# Patient Record
Sex: Female | Born: 1945 | Race: White | Hispanic: No | Smoking: Former smoker
Health system: Southern US, Community
[De-identification: ages and names within clinical notes are randomized; demographics above are authoritative.]

## PROBLEM LIST (undated history)

## (undated) DIAGNOSIS — J45909 Unspecified asthma, uncomplicated: Secondary | ICD-10-CM

## (undated) DIAGNOSIS — I82409 Acute embolism and thrombosis of unspecified deep veins of unspecified lower extremity: Secondary | ICD-10-CM

## (undated) HISTORY — PX: NO PAST SURGERIES: SHX2092

---

## 1997-11-06 ENCOUNTER — Ambulatory Visit (HOSPITAL_COMMUNITY): Admission: RE | Admit: 1997-11-06 | Discharge: 1997-11-06 | Payer: Self-pay | Admitting: *Deleted

## 1998-10-29 ENCOUNTER — Encounter: Payer: Self-pay | Admitting: *Deleted

## 1998-10-29 ENCOUNTER — Ambulatory Visit (HOSPITAL_COMMUNITY): Admission: RE | Admit: 1998-10-29 | Discharge: 1998-10-29 | Payer: Self-pay | Admitting: *Deleted

## 1999-11-02 ENCOUNTER — Encounter: Payer: Self-pay | Admitting: *Deleted

## 1999-11-02 ENCOUNTER — Ambulatory Visit (HOSPITAL_COMMUNITY): Admission: RE | Admit: 1999-11-02 | Discharge: 1999-11-02 | Payer: Self-pay | Admitting: *Deleted

## 2000-11-03 ENCOUNTER — Ambulatory Visit (HOSPITAL_COMMUNITY): Admission: RE | Admit: 2000-11-03 | Discharge: 2000-11-03 | Payer: Self-pay | Admitting: *Deleted

## 2000-11-03 ENCOUNTER — Encounter: Payer: Self-pay | Admitting: *Deleted

## 2001-11-06 ENCOUNTER — Ambulatory Visit (HOSPITAL_COMMUNITY): Admission: RE | Admit: 2001-11-06 | Discharge: 2001-11-06 | Payer: Self-pay | Admitting: *Deleted

## 2001-11-06 ENCOUNTER — Encounter: Payer: Self-pay | Admitting: *Deleted

## 2002-11-14 ENCOUNTER — Ambulatory Visit (HOSPITAL_COMMUNITY): Admission: RE | Admit: 2002-11-14 | Discharge: 2002-11-14 | Payer: Self-pay | Admitting: *Deleted

## 2002-11-14 ENCOUNTER — Encounter: Payer: Self-pay | Admitting: *Deleted

## 2003-11-14 ENCOUNTER — Ambulatory Visit (HOSPITAL_COMMUNITY): Admission: RE | Admit: 2003-11-14 | Discharge: 2003-11-14 | Payer: Self-pay | Admitting: *Deleted

## 2004-11-16 ENCOUNTER — Ambulatory Visit (HOSPITAL_COMMUNITY): Admission: RE | Admit: 2004-11-16 | Discharge: 2004-11-16 | Payer: Self-pay | Admitting: *Deleted

## 2005-11-17 ENCOUNTER — Ambulatory Visit (HOSPITAL_COMMUNITY): Admission: RE | Admit: 2005-11-17 | Discharge: 2005-11-17 | Payer: Self-pay | Admitting: Obstetrics and Gynecology

## 2006-11-23 ENCOUNTER — Ambulatory Visit (HOSPITAL_COMMUNITY): Admission: RE | Admit: 2006-11-23 | Discharge: 2006-11-23 | Payer: Self-pay | Admitting: Obstetrics and Gynecology

## 2007-05-14 ENCOUNTER — Ambulatory Visit: Payer: Self-pay | Admitting: *Deleted

## 2007-05-14 ENCOUNTER — Emergency Department (HOSPITAL_COMMUNITY): Admission: EM | Admit: 2007-05-14 | Discharge: 2007-05-14 | Payer: Self-pay | Admitting: Emergency Medicine

## 2007-05-14 ENCOUNTER — Encounter (INDEPENDENT_AMBULATORY_CARE_PROVIDER_SITE_OTHER): Payer: Self-pay | Admitting: Emergency Medicine

## 2007-11-24 ENCOUNTER — Ambulatory Visit (HOSPITAL_COMMUNITY): Admission: RE | Admit: 2007-11-24 | Discharge: 2007-11-24 | Payer: Self-pay | Admitting: Obstetrics and Gynecology

## 2008-11-29 ENCOUNTER — Ambulatory Visit (HOSPITAL_COMMUNITY): Admission: RE | Admit: 2008-11-29 | Discharge: 2008-11-29 | Payer: Self-pay | Admitting: Obstetrics and Gynecology

## 2015-06-03 MED FILL — PENICILLIN VK 500 MG TABLET: 500 | 7 days supply | Qty: 30 | Fill #0

## 2015-07-14 DIAGNOSIS — H5202 Hypermetropia, left eye: Secondary | ICD-10-CM | POA: Diagnosis not present

## 2015-07-14 DIAGNOSIS — H5211 Myopia, right eye: Secondary | ICD-10-CM | POA: Diagnosis not present

## 2015-07-14 DIAGNOSIS — H524 Presbyopia: Secondary | ICD-10-CM | POA: Diagnosis not present

## 2015-07-14 DIAGNOSIS — H52223 Regular astigmatism, bilateral: Secondary | ICD-10-CM | POA: Diagnosis not present

## 2015-07-14 DIAGNOSIS — H2513 Age-related nuclear cataract, bilateral: Secondary | ICD-10-CM | POA: Diagnosis not present

## 2015-11-11 DIAGNOSIS — I83813 Varicose veins of bilateral lower extremities with pain: Secondary | ICD-10-CM | POA: Diagnosis not present

## 2015-11-17 DIAGNOSIS — I83811 Varicose veins of right lower extremities with pain: Secondary | ICD-10-CM | POA: Diagnosis not present

## 2015-11-17 DIAGNOSIS — I8311 Varicose veins of right lower extremity with inflammation: Secondary | ICD-10-CM | POA: Diagnosis not present

## 2015-11-24 ENCOUNTER — Encounter (HOSPITAL_COMMUNITY): Payer: Self-pay | Admitting: Emergency Medicine

## 2015-11-24 ENCOUNTER — Emergency Department (HOSPITAL_COMMUNITY)
Admission: EM | Admit: 2015-11-24 | Discharge: 2015-11-24 | Disposition: A | Discharge status: Home / Self Care | Payer: Self-pay | Attending: Emergency Medicine | Admitting: Emergency Medicine

## 2015-11-24 DIAGNOSIS — M79602 Pain in left arm: Secondary | ICD-10-CM | POA: Diagnosis not present

## 2015-11-24 DIAGNOSIS — Z87891 Personal history of nicotine dependence: Secondary | ICD-10-CM | POA: Insufficient documentation

## 2015-11-24 HISTORY — DX: Acute embolism and thrombosis of unspecified deep veins of unspecified lower extremity: I82.409

## 2015-11-24 NOTE — ED Provider Notes (Signed)
[facility]-EMERGENCY DEPT Provider Note   CSN: [id number] Arrival date & time: 11/24/15  1120     History   Chief Complaint Chief Complaint  Patient presents with  . Arm Pain    HPI Jessica DoeCarolyn L Dingley is a 70 y.o. female.  The history is provided by the patient and medical records. No language interpreter was used.  Arm Pain  Pertinent negatives include no abdominal pain, no headaches and no shortness of breath.   Jessica Brown is a 70 y.o. female  with a PMH of DVT who presents to the Emergency Department complaining of dull achy left arm pain in the area where she received her flu shot approximately 3 weeks ago. She has been taking 3 ibuprofen at home with good relief of pain. Endorses associated "pins and needles" sensation around upper arm that will radiate down to mid forearm. Denies chest pain, shortness of breath, swelling, redness, fevers, neck pain, back pain. No history of similar sxs. No aggravating factors noted.   Past Medical History:  Diagnosis Date  . DVT (deep venous thrombosis) (HCC)     There are no active problems to display for this patient.   History reviewed. No pertinent surgical history.  OB History    No data available       Home Medications    Prior to Admission medications   Not on File    Family History History reviewed. No pertinent family history.  Social History Social History  Substance Use Topics  . Smoking status: Former Games developermoker  . Smokeless tobacco: Not on file  . Alcohol use No     Allergies   Review of patient's allergies indicates no known allergies.   Review of Systems Review of Systems  Constitutional: Negative for diaphoresis and fever.  HENT: Negative for congestion.   Eyes: Negative for visual disturbance.  Respiratory: Negative for cough and shortness of breath.   Cardiovascular: Negative.   Gastrointestinal: Negative for abdominal pain, nausea and vomiting.  Genitourinary: Negative for dysuria.    Musculoskeletal: Positive for myalgias. Negative for joint swelling and neck pain.  Skin: Negative for color change and rash.  Neurological: Negative for headaches.     Physical Exam Updated Vital Signs BP 118/76   Pulse 84   Temp 98.1 F (36.7 C) (Oral)   Resp 15   SpO2 100%   Physical Exam  Constitutional: She is oriented to person, place, and time. She appears well-developed and well-nourished. No distress.  HENT:  Head: Normocephalic and atraumatic.  Neck:  No midline or paraspinal tenderness.   Cardiovascular: Normal rate, regular rhythm, normal heart sounds and intact distal pulses.   No murmur heard. Pulmonary/Chest: Effort normal and breath sounds normal. No respiratory distress. She has no wheezes. She has no rales. She exhibits no tenderness.  Abdominal: Soft. She exhibits no distension. There is no tenderness.  Musculoskeletal:  Full ROM without pain. No tenderness, erythema, swelling, bruising. 2+ radial pulse. Sensation intact in axillary, radial, ulnar, median nerve distributions.  Neurological: She is alert and oriented to person, place, and time.  Skin: Skin is warm and dry.  Nursing note and vitals reviewed.    ED Treatments / Results  Labs (all labs ordered are listed, but only abnormal results are displayed) Labs Reviewed - No data to display  EKG  EKG Interpretation None       Radiology No results found.  Procedures Procedures (including critical care time)  Medications Ordered in ED Medications - No data  to display   Initial Impression / Assessment and Plan / ED Course  I have reviewed the triage vital signs and the nursing notes.  Pertinent labs & imaging results that were available during my care of the patient were reviewed by me and considered in my medical decision making (see chart for details).  Clinical Course   Jessica Brown is a 70 y.o. female who presents to ED for left arm pain x 3 weeks around injection site where she  received flu vaccine. Intermittent "pins and needles" sensation in this area which will occasionally radiate down to mid-forearm. On exam, patient is very well appearing with reassuring vital signs. No midline tenderness. Full ROM and 5/5 muscle strength. No overlying skin changes. No swelling. Evaluation does not show pathology that would require ongoing emergent intervention or inpatient treatment. PCP follow up if symptoms persist next week. Return precautions discussed and all questions answered.    Final Clinical Impressions(s) / ED Diagnoses   Final diagnoses:  Left arm pain    New Prescriptions There are no discharge medications for this patient.    Jessica PicketJaime Brown Jessica Little, PA-C 11/24/15 1605    Marily MemosJason Mesner, MD 11/24/15 [phone]

## 2015-11-24 NOTE — ED Triage Notes (Signed)
Advil treats pain effectively.

## 2015-11-24 NOTE — ED Triage Notes (Signed)
Pt c/o left arm pain and "pins and needles" x 1 week, radiating down arm and into shoulder. No chest discomfort, SOB. No recent injury. Attributes pain to flu shot obtained about a month ago.

## 2015-11-24 NOTE — Progress Notes (Signed)
Pt confirms no pcp to follow up with  Cm provided pt a list of in network [facility]  providers for f/u care  Pt informed Cm her ED RN stated she could come back to the ED

## 2015-11-24 NOTE — Discharge Instructions (Signed)
Continue taking ibuprofen as needed for pain. Follow up with your primary care physician if symptoms do not improve in the next week. Return to ER for new or worsening symptoms, any additional concerns.

## 2015-11-24 NOTE — ED Provider Notes (Signed)
Medical screening examination/treatment/procedure(s) were conducted as a shared visit with non-physician practitioner(s) and myself.  I personally evaluated the patient during the encounter.  Arm pain around flu shot from a few weeks ago.  On my exam, no swelling, erythema, ttp. Neuro exam in left upper extremity normal.  Unsure of cause. Considered possible neurologic or cervical abnormality but no persistent symptoms. Possibly localized reaction from medication? Will continue nsaids at home.    Marily MemosJason Shanena Pellegrino, MD 11/24/15 [phone]

## 2015-12-01 DIAGNOSIS — I83811 Varicose veins of right lower extremities with pain: Secondary | ICD-10-CM | POA: Diagnosis not present

## 2015-12-01 DIAGNOSIS — I83891 Varicose veins of right lower extremities with other complications: Secondary | ICD-10-CM | POA: Diagnosis not present

## 2015-12-01 DIAGNOSIS — I8311 Varicose veins of right lower extremity with inflammation: Secondary | ICD-10-CM | POA: Diagnosis not present

## 2015-12-31 DIAGNOSIS — I8311 Varicose veins of right lower extremity with inflammation: Secondary | ICD-10-CM | POA: Diagnosis not present

## 2015-12-31 DIAGNOSIS — I83891 Varicose veins of right lower extremities with other complications: Secondary | ICD-10-CM | POA: Diagnosis not present

## 2016-01-05 DIAGNOSIS — I8311 Varicose veins of right lower extremity with inflammation: Secondary | ICD-10-CM | POA: Diagnosis not present

## 2016-02-05 DIAGNOSIS — I8311 Varicose veins of right lower extremity with inflammation: Secondary | ICD-10-CM | POA: Diagnosis not present

## 2016-02-05 DIAGNOSIS — I83891 Varicose veins of right lower extremities with other complications: Secondary | ICD-10-CM | POA: Diagnosis not present

## 2017-07-01 ENCOUNTER — Emergency Department (HOSPITAL_COMMUNITY)
Admission: EM | Admit: 2017-07-01 | Discharge: 2017-07-01 | Disposition: A | Discharge status: Home / Self Care | Payer: 59 | Attending: Emergency Medicine | Admitting: Emergency Medicine

## 2017-07-01 ENCOUNTER — Encounter (HOSPITAL_COMMUNITY): Payer: Self-pay | Admitting: Emergency Medicine

## 2017-07-01 DIAGNOSIS — Z23 Encounter for immunization: Secondary | ICD-10-CM | POA: Insufficient documentation

## 2017-07-01 DIAGNOSIS — Y93H2 Activity, gardening and landscaping: Secondary | ICD-10-CM | POA: Diagnosis not present

## 2017-07-01 DIAGNOSIS — Y92007 Garden or yard of unspecified non-institutional (private) residence as the place of occurrence of the external cause: Secondary | ICD-10-CM | POA: Diagnosis not present

## 2017-07-01 DIAGNOSIS — W57XXXA Bitten or stung by nonvenomous insect and other nonvenomous arthropods, initial encounter: Secondary | ICD-10-CM | POA: Insufficient documentation

## 2017-07-01 DIAGNOSIS — Y999 Unspecified external cause status: Secondary | ICD-10-CM | POA: Diagnosis not present

## 2017-07-01 DIAGNOSIS — Z87891 Personal history of nicotine dependence: Secondary | ICD-10-CM | POA: Diagnosis not present

## 2017-07-01 DIAGNOSIS — S80262A Insect bite (nonvenomous), left knee, initial encounter: Secondary | ICD-10-CM | POA: Insufficient documentation

## 2017-07-01 MED ORDER — TETANUS-DIPHTH-ACELL PERTUSSIS 5-2.5-18.5 LF-MCG/0.5 IM SUSP
0.5000 mL | Freq: Once | INTRAMUSCULAR | Status: AC
  Administered 2017-07-01: 0.5 mL via INTRAMUSCULAR
  Filled 2017-07-01: qty 0.5

## 2017-07-01 MED ORDER — DOXYCYCLINE HYCLATE 100 MG PO TABS
200.0000 mg | ORAL_TABLET | Freq: Once | ORAL | Status: AC
  Administered 2017-07-01: 200 mg via ORAL
  Filled 2017-07-01: qty 2

## 2017-07-01 NOTE — Discharge Instructions (Addendum)
Keep wound clean with mild soap and water. Keep area covered with a topical antibiotic ointment and bandage. Ice and elevate for additional pain and swelling relief. Alternate between Ibuprofen and Tylenol for additional pain relief. You were given an antibiotic today that will cover you for possible lyme disease, so you shouldn't have any issues with this. Your tetanus shot was also updated. Follow up with your primary care doctor in approximately 5-7 days for wound recheck. Monitor area for signs of infection to include, but not limited to: increasing pain, spreading redness, drainage/pus, worsening swelling, or fevers. Return to emergency department for emergent changing or worsening symptoms.

## 2017-07-01 NOTE — ED Triage Notes (Signed)
Per pt, states she noct iced a tick behind her left knee-no rashes, no fevers

## 2017-07-01 NOTE — ED Provider Notes (Signed)
[facility]-EMERGENCY DEPT Provider Note   CSN: [id number] Arrival date & time: 07/01/17  1120     History   Chief Complaint Chief Complaint  Patient presents with  . Tick Removal    HPI Jessica Brown is a 72 y.o. female with a PMHx of DVT, who presents to the ED with complaints of which she believes to be a tick that is embedded in the left posterior knee.  She states that she does a lot of yard work pulling weeds and is outside a lot, 2 days ago noticed a bump on her knee posteriorly.  She was not aware until today that it was a tick.  She denies any pain to the area, has not tried anything for it, no known aggravating factors.  Additionally she denies any fevers, chills, CP, SOB, abd pain, N/V/D/C, hematuria, dysuria, myalgias, arthralgias, joint swelling, rashes, numbness, tingling, focal weakness, or any other complaints at this time.  NKDA. Otherwise healthy. Unsure of last Tdap.   The history is provided by the patient and medical records. No language interpreter was used.    Past Medical History:  Diagnosis Date  . DVT (deep venous thrombosis) (HCC)     There are no active problems to display for this patient.   History reviewed. No pertinent surgical history.   OB History   None      Home Medications    Prior to Admission medications   Not on File    Family History No family history on file.  Social History Social History   Tobacco Use  . Smoking status: Former Smoker  Substance Use Topics  . Alcohol use: No  . Drug use: No     Allergies   Patient has no known allergies.   Review of Systems Review of Systems  Constitutional: Negative for chills and fever.  Eyes: Negative for pain and redness.  Respiratory: Negative for shortness of breath.   Cardiovascular: Negative for chest pain.  Gastrointestinal: Negative for abdominal pain, constipation, diarrhea, nausea and vomiting.  Genitourinary: Negative for dysuria and  hematuria.  Musculoskeletal: Negative for arthralgias, joint swelling and myalgias.  Skin: Positive for wound (tick embedded into skin of knee). Negative for rash.  Allergic/Immunologic: Negative for immunocompromised state.  Neurological: Negative for weakness and numbness.  Psychiatric/Behavioral: Negative for confusion.   All other systems reviewed and are negative for acute change except as noted in the HPI.    Physical Exam Updated Vital Signs BP (!) 156/72 (BP Location: Right Arm)   Pulse 72   Temp 98.3 F (36.8 C) (Oral)   Resp 18   Ht 5\' 3"  (1.6 m)   Wt 72.4 kg (159 lb 11.2 oz)   SpO2 98%   BMI 28.29 kg/m   Physical Exam  Constitutional: She is oriented to person, place, and time. Vital signs are normal. She appears well-developed and well-nourished.  Non-toxic appearance. No distress.  Afebrile, nontoxic, NAD  HENT:  Head: Normocephalic and atraumatic.  Mouth/Throat: Mucous membranes are normal.  Eyes: Conjunctivae and EOM are normal. Right eye exhibits no discharge. Left eye exhibits no discharge.  Neck: Normal range of motion. Neck supple.  Cardiovascular: Normal rate and intact distal pulses.  Pulmonary/Chest: Effort normal. No respiratory distress.  Abdominal: Normal appearance. She exhibits no distension.  Musculoskeletal: Normal range of motion.  Neurological: She is alert and oriented to person, place, and time. She has normal strength. No sensory deficit.  Skin: Skin is warm, dry and  intact. Lesion (embedded tick) noted. No rash noted.  Tick embedded into popliteal fossa of L knee, slightly engorged, fully intact when removed. Still alive. No rashes, no surrounding erythema/warmth to the area, no drainage or evidence of infection to the wound.   Psychiatric: She has a normal mood and affect. Her behavior is normal.  Nursing note and vitals reviewed.    ED Treatments / Results  Labs (all labs ordered are listed, but only abnormal results are  displayed) Labs Reviewed - No data to display  EKG None  Radiology No results found.  Procedures .Foreign Body Removal Date/Time: 07/01/2017 3:45 PM Performed by: Rhona Raider, PA-C Authorized by: Rhona Raider, [place]  Consent: Verbal consent obtained. Risks and benefits: risks, benefits and alternatives were discussed Consent given by: patient Patient understanding: patient states understanding of the procedure being performed Patient consent: the patient's understanding of the procedure matches consent given Patient identity confirmed: verbally with patient Body area: skin General location: lower extremity Location details: left knee  Sedation: Patient sedated: no  Patient restrained: no Patient cooperative: yes Localization method: visualized Removal mechanism: cotton tipped applicator. Tendon involvement: none Depth: subcutaneous Complexity: simple 1 objects recovered. Objects recovered: tick Post-procedure assessment: foreign body removed Patient tolerance: Patient tolerated the procedure well with no immediate complications   (including critical care time)  Medications Ordered in ED Medications  doxycycline (VIBRA-TABS) tablet 200 mg (200 mg Oral Given 07/01/17 1613)  Tdap (BOOSTRIX) injection 0.5 mL (0.5 mLs Intramuscular Given 07/01/17 1614)     Initial Impression / Assessment and Plan / ED Course  I have reviewed the triage vital signs and the nursing notes.  Pertinent labs & imaging results that were available during my care of the patient were reviewed by me and considered in my medical decision making (see chart for details).     72 y.o. female here with tick embedded into L popliteal fossa of knee. Has been on there for 2 days, she didn't know it was a tick. Tick was successfully removed by using a cotton tipped applicator and slowly twist the tick in circular motions until it released, entirety of tick was removed. Will cover with prophylactic dose  of doxy, and update Tdap. Doubt need for ongoing abx. Advised local wound care, and f/up with PCP. Discussed case with my attending Dr. Juleen China who agrees with plan. I explained the diagnosis and have given explicit precautions to return to the ER including for any other new or worsening symptoms. The patient understands and accepts the medical plan as it's been dictated and I have answered their questions. Discharge instructions concerning home care and prescriptions have been given. The patient is STABLE and is discharged to home in good condition.    Final Clinical Impressions(s) / ED Diagnoses   Final diagnoses:  Tick bite with subsequent removal of tick    ED Discharge Orders    [address] [place], [place], [place] 07/01/17 1620    Raeford Razor, MD 07/04/17 [phone]

## 2019-01-17 ENCOUNTER — Other Ambulatory Visit: Payer: Self-pay

## 2019-01-17 ENCOUNTER — Ambulatory Visit: Payer: 59 | Attending: Internal Medicine

## 2019-01-17 DIAGNOSIS — Z20822 Contact with and (suspected) exposure to covid-19: Secondary | ICD-10-CM

## 2019-01-17 DIAGNOSIS — Z20828 Contact with and (suspected) exposure to other viral communicable diseases: Secondary | ICD-10-CM | POA: Diagnosis not present

## 2019-01-19 LAB — NOVEL CORONAVIRUS, NAA: SARS-CoV-2, NAA: NOT DETECTED

## 2019-01-22 ENCOUNTER — Telehealth: Payer: Self-pay | Admitting: General Practice

## 2019-01-22 NOTE — Telephone Encounter (Signed)
Negative COVID results given. Patient results "NOT Detected." Caller expressed understanding. ° °

## 2019-06-26 ENCOUNTER — Encounter (HOSPITAL_BASED_OUTPATIENT_CLINIC_OR_DEPARTMENT_OTHER): Payer: Self-pay

## 2019-06-26 ENCOUNTER — Emergency Department (HOSPITAL_BASED_OUTPATIENT_CLINIC_OR_DEPARTMENT_OTHER): Payer: 59

## 2019-06-26 ENCOUNTER — Emergency Department (HOSPITAL_BASED_OUTPATIENT_CLINIC_OR_DEPARTMENT_OTHER)
Admission: EM | Admit: 2019-06-26 | Discharge: 2019-06-26 | Disposition: A | Discharge status: Home / Self Care | Payer: 59 | Attending: Emergency Medicine | Admitting: Emergency Medicine

## 2019-06-26 ENCOUNTER — Other Ambulatory Visit: Payer: Self-pay

## 2019-06-26 DIAGNOSIS — M7989 Other specified soft tissue disorders: Secondary | ICD-10-CM | POA: Diagnosis not present

## 2019-06-26 DIAGNOSIS — Y929 Unspecified place or not applicable: Secondary | ICD-10-CM | POA: Diagnosis not present

## 2019-06-26 DIAGNOSIS — M25471 Effusion, right ankle: Secondary | ICD-10-CM

## 2019-06-26 DIAGNOSIS — S82491A Other fracture of shaft of right fibula, initial encounter for closed fracture: Secondary | ICD-10-CM | POA: Diagnosis not present

## 2019-06-26 DIAGNOSIS — Y999 Unspecified external cause status: Secondary | ICD-10-CM | POA: Diagnosis not present

## 2019-06-26 DIAGNOSIS — X58XXXA Exposure to other specified factors, initial encounter: Secondary | ICD-10-CM | POA: Diagnosis not present

## 2019-06-26 DIAGNOSIS — Y939 Activity, unspecified: Secondary | ICD-10-CM | POA: Insufficient documentation

## 2019-06-26 DIAGNOSIS — S99911A Unspecified injury of right ankle, initial encounter: Secondary | ICD-10-CM | POA: Diagnosis present

## 2019-06-26 DIAGNOSIS — M25571 Pain in right ankle and joints of right foot: Secondary | ICD-10-CM

## 2019-06-26 DIAGNOSIS — S82424A Nondisplaced transverse fracture of shaft of right fibula, initial encounter for closed fracture: Secondary | ICD-10-CM | POA: Diagnosis not present

## 2019-06-26 DIAGNOSIS — Z86718 Personal history of other venous thrombosis and embolism: Secondary | ICD-10-CM | POA: Diagnosis not present

## 2019-06-26 NOTE — ED Notes (Signed)
Pt discharged to home. Discharge instructions have been discussed with patient and/or family members. Pt verbally acknowledges understanding d/c instructions, and endorses comprehension to checkout at registration before leaving.  °

## 2019-06-26 NOTE — Discharge Instructions (Signed)
You have a fracture of the fibula of your right leg.  This is a nonweightbearing bone however it is important for you to wear your walking boot as we discussed.  I discussed your case with the orthopedic surgeon who will see you in office either late this week or early next week.  Please call the office phone number that I provided you with to make this appointment.  Please use Tylenol or ibuprofen for pain.  You may use 600 mg ibuprofen every 6 hours or 1000 mg of Tylenol every 6 hours.  You may choose to alternate between the 2.  This would be most effective.  Not to exceed 4 g of Tylenol within 24 hours.  Not to exceed 3200 mg ibuprofen 24 hours.   I have also provided you with a vascular medicine doctor as you have not followed up with you as recently you may reestablish with this vein specialist as needed.

## 2019-06-26 NOTE — ED Triage Notes (Signed)
Pt arrives with swelling and pain to right ankle X3 days, denies injury, pt reports working on feet a lot.

## 2019-06-26 NOTE — ED Provider Notes (Signed)
[facility] EMERGENCY DEPARTMENT Provider Note   CSN: [id number] Arrival date & time: 06/26/19  1044     History Chief Complaint  Patient presents with  . Leg Pain    Jessica Brown is a 74 y.o. female.  HPI  Patient is a 74 year old female with a history of DVT was unprovoked by surgery or other circumstance many years ago (patient was unable to specify # of years).   Patient is presented today for 3 days of EKG, constant right ankle/lower extremity pain that she states is worse at the end of the day.  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  She states that she is very active.  She denies any trauma.  She states that she has noticed some swelling in her legs.  She has chronic bilateral varicose veins.  She has been seen by a vein specialist in the past because of her DVT and because of her varicosities but has not been seen for some time.  She states that she was on warfarin for several years but stopped taking it greater than 5 years ago.  Patient denies any chest pain, shortness of breath, headache, lightheadedness, dizziness.     Past Medical History:  Diagnosis Date  . DVT (deep venous thrombosis) (HCC)     There are no problems to display for this patient.   History reviewed. No pertinent surgical history.   OB History   No obstetric history on file.     No family history on file.  Social History   Tobacco Use  . Smoking status: Former Smoker  Substance Use Topics  . Alcohol use: No  . Drug use: No    Home Medications Prior to Admission medications   Not on File    Allergies    Patient has no known allergies.  Review of Systems   Review of Systems  Constitutional: Negative for fever.  HENT: Negative for congestion.   Respiratory: Negative for shortness of breath.   Cardiovascular: Negative for chest pain.  Gastrointestinal: Negative for abdominal distention.  Musculoskeletal:      Right ankle, leg pain  Neurological: Negative for dizziness and headaches.    Physical Exam Updated Vital Signs BP (!) 146/74 (BP Location: Right Arm)   Pulse 85   Temp 98.6 F (37 C) (Oral)   Resp 16   Ht 5\' 3"  (1.6 m)   Wt 75.8 kg   SpO2 98%   BMI 29.58 kg/m   Physical Exam Vitals and nursing note reviewed.  Constitutional:      General: She is not in acute distress.    Appearance: Normal appearance. She is not ill-appearing.  HENT:     Head: Normocephalic and atraumatic.     Mouth/Throat:     Mouth: Mucous membranes are moist.  Eyes:     General: No scleral icterus.       Right eye: No discharge.        Left eye: No discharge.     Conjunctiva/sclera: Conjunctivae normal.  Cardiovascular:     Comments: DP and PT pulses 3+ symmetric bilaterally Pulmonary:     Effort: Pulmonary effort is normal.     Breath sounds: No stridor.  Musculoskeletal:     Comments: Mild tenderness to palpation of the right lateral malleolus.  No other tenderness of the right ankle.  There is mild swelling.  Some mild calf tenderness.  Skin:    General: Skin is warm and dry.     Capillary Refill: Capillary refill takes less than 2 seconds.  Neurological:     Mental Status: She is alert and oriented to person, place, and time. Mental status is at baseline.  Psychiatric:        Mood and Affect: Mood normal.        Behavior: Behavior normal.     ED Results / Procedures / Treatments   Labs (all labs ordered are listed, but only abnormal results are displayed) Labs Reviewed - No data to display  EKG None  Radiology DG Tibia/Fibula Right  Result Date: 06/26/2019 CLINICAL DATA:  Distal fracture, concern for proximal fracture. EXAM: RIGHT TIBIA AND FIBULA - 2 VIEW COMPARISON:  Same-day right ankle radiographs. FINDINGS: Nondisplaced transverse fracture deformity of the distal fibular shaft. Chronic posttraumatic deformity of the mid to lower fibular shaft. Knee and ankle remain  approximated. Moderate knee osteoarthrosis with chronic lateral tibial plateau depression deformity. Diffuse ankle soft tissue swelling. IMPRESSION: Acute nondisplaced transverse distal fibular shaft fracture. Chronic mid to lower fibular shaft posttraumatic deformity. Moderate knee osteoarthrosis with chronic lateral tibial plateau depression deformity. Electronically Signed   By: Stana Bunting M.D.   On: 06/26/2019 13:22   DG Ankle Complete Right  Result Date: 06/26/2019 CLINICAL DATA:  Right lateral ankle pain and swelling for 3 days. EXAM: RIGHT ANKLE - COMPLETE 3+ VIEW COMPARISON:  None. FINDINGS: Evidence of remote healed distal fibular fracture at the middle third distal third junction region. There is also a stress fracture involving the distal fibular shaft. No displacement. Some early cortical reaction. The tibia is intact. The ankle joint is maintained. No ankle fracture or osteochondral lesion. No definite joint effusion. The mid and hindfoot bony structures are intact. Moderate diffuse soft tissue swelling. IMPRESSION: 1. Nondisplaced stress fracture involving the distal fibular shaft. 2. Remote healed mid distal fibular shaft fracture. 3. No acute ankle fracture. Electronically Signed   By: Rudie Meyer M.D.   On: 06/26/2019 12:07   US Venous Img Lower Right (DVT Study)  Result Date: 06/26/2019 CLINICAL DATA:  Right ankle pain and swelling. EXAM: Right LOWER EXTREMITY VENOUS DOPPLER ULTRASOUND TECHNIQUE: Gray-scale sonography with compression, as well as color and duplex ultrasound, were performed to evaluate the deep venous system(s) from the level of the common femoral vein through the popliteal and proximal calf veins. COMPARISON:  None. FINDINGS: VENOUS Normal compressibility of the common femoral, superficial femoral, and popliteal veins, as well as the visualized calf veins. Visualized portions of profunda femoral vein and great saphenous vein unremarkable. No filling defects to  suggest DVT on grayscale or color Doppler imaging. Doppler waveforms show normal direction of venous flow, normal respiratory plasticity and response to augmentation. Limited views of the contralateral common femoral vein are unremarkable. OTHER None. Limitations: none IMPRESSION: Negative. Electronically Signed   By: Lupita Raider M.D.   On: 06/26/2019 12:17    Procedures Procedures (including critical care time)  Medications Ordered in ED Medications - No data to display  ED Course  I have reviewed the triage vital signs and the nursing notes.  Pertinent labs & imaging results that were available during my care of the patient were reviewed by me and considered in my medical decision making (see chart for details).  Patient with remote history of DVT was on warfarin for some number of years that she is uncertain of and has been off any anticoagulation for several  years.  She is presenting with right lower leg pain.  Some concern for DVT and there is some swelling of the right ankle with bony tenderness over the lateral malleolus.  She denies any trauma low suspicion for fracture however will obtain x-ray to rule out.  Clinical Course as of Jun 26 1419  Tue Jun 26, 2019  1328 X-ray imaging of the ankle significant for distal fibular fracture that is nondisplaced consistent with a stress fracture likely due to this patient being on her feet all day /daily. DVT scan negative. Discussed with Dr. Ronnald Nian who recommends ortho consultation and full tib/fib xray.    [WF]    Clinical Course User Index [WF] Tedd Sias, [place]   MDM Rules/Calculators/A&P                      I discussed this case with my attending physician who cosigned this note including patient's presenting symptoms, physical exam, and planned diagnostics and interventions. Attending physician stated agreement with plan or made changes to plan which were implemented.   Attending physician assessed patient at bedside.    Discussed with Ophelia Charter of orthopedics who recommends walking boot and follow up in the next week or so.   Patient with Tylenol and ibuprofen for pain.  Final Clinical Impression(s) / ED Diagnoses Final diagnoses:  Acute right ankle pain  Right ankle swelling  Other closed fracture of shaft of right fibula, initial encounter    Rx / DC Orders ED Discharge Orders    None       Tedd Sias, [place] 06/26/19 1421    Lennice Sites, DO 06/26/19 1454

## 2019-06-26 NOTE — ED Notes (Signed)
Patient transported to X-ray 

## 2019-06-29 DIAGNOSIS — M25571 Pain in right ankle and joints of right foot: Secondary | ICD-10-CM | POA: Diagnosis not present

## 2019-07-23 MED FILL — CELECOXIB 200 MG CAP: 200 | 30 days supply | Qty: 60 | Fill #0

## 2019-08-10 DIAGNOSIS — M25561 Pain in right knee: Secondary | ICD-10-CM | POA: Diagnosis not present

## 2019-08-10 DIAGNOSIS — M25571 Pain in right ankle and joints of right foot: Secondary | ICD-10-CM | POA: Diagnosis not present

## 2019-08-22 MED FILL — CELECOXIB 200 MG CAP: 200 | 30 days supply | Qty: 60 | Fill #1

## 2020-02-08 DIAGNOSIS — M25571 Pain in right ankle and joints of right foot: Secondary | ICD-10-CM | POA: Diagnosis not present

## 2020-02-22 ENCOUNTER — Other Ambulatory Visit: Payer: Self-pay | Admitting: Orthopaedic Surgery

## 2020-02-22 DIAGNOSIS — M79661 Pain in right lower leg: Secondary | ICD-10-CM | POA: Diagnosis not present

## 2020-02-22 DIAGNOSIS — M1711 Unilateral primary osteoarthritis, right knee: Secondary | ICD-10-CM | POA: Diagnosis not present

## 2020-02-25 ENCOUNTER — Ambulatory Visit (HOSPITAL_COMMUNITY): Payer: 59 | Admitting: Anesthesiology

## 2020-02-25 ENCOUNTER — Encounter (HOSPITAL_COMMUNITY): Payer: Self-pay | Admitting: Orthopaedic Surgery

## 2020-02-25 ENCOUNTER — Other Ambulatory Visit (HOSPITAL_COMMUNITY)
Admission: RE | Admit: 2020-02-25 | Discharge: 2020-02-25 | Disposition: A | Discharge status: Home / Self Care | Payer: 59 | Source: Ambulatory Visit | Attending: Orthopaedic Surgery | Admitting: Orthopaedic Surgery

## 2020-02-25 DIAGNOSIS — Z20822 Contact with and (suspected) exposure to covid-19: Secondary | ICD-10-CM | POA: Diagnosis not present

## 2020-02-25 DIAGNOSIS — Z01812 Encounter for preprocedural laboratory examination: Secondary | ICD-10-CM | POA: Diagnosis not present

## 2020-02-25 LAB — SARS CORONAVIRUS 2 (TAT 6-24 HRS): SARS Coronavirus 2: NEGATIVE

## 2020-02-25 NOTE — Progress Notes (Signed)
Spoke with pt for pre-op call. Pt denies cardiac history, HTN or Diabetes.  Covid test schedule for today. Instructed pt that she is to quarantine after the test is done and until she comes to the hospital tomorrow. Pt voiced understanding.

## 2020-02-25 NOTE — Anesthesia Preprocedure Evaluation (Signed)
Anesthesia Evaluation    Airway        Dental   Pulmonary asthma , former smoker,           Cardiovascular + DVT       Neuro/Psych negative neurological ROS  negative psych ROS   GI/Hepatic negative GI ROS, Neg liver ROS,   Endo/Other  negative endocrine ROS  Renal/GU negative Renal ROS  negative genitourinary   Musculoskeletal Right tibial fracture   Abdominal   Peds  Hematology negative hematology ROS (+)   Anesthesia Other Findings   Reproductive/Obstetrics                             Anesthesia Physical Anesthesia Plan  ASA: II  Anesthesia Plan: General and Regional   Post-op Pain Management:  Regional for Post-op pain   Induction: Intravenous  PONV Risk Score and Plan: 3 and Ondansetron, Treatment may vary due to age or medical condition and Dexamethasone  Airway Management Planned: Mask and Oral ETT  Additional Equipment: None  Intra-op Plan:   Post-operative Plan: Extubation in OR  Informed Consent:   Plan Discussed with: CRNA  Anesthesia Plan Comments:         Anesthesia Quick Evaluation

## 2020-02-26 ENCOUNTER — Encounter (HOSPITAL_COMMUNITY)
Admission: RE | Disposition: A | Discharge status: Home / Self Care | Payer: Self-pay | Source: Home / Self Care | Attending: Orthopaedic Surgery

## 2020-02-26 ENCOUNTER — Encounter (HOSPITAL_COMMUNITY): Payer: Self-pay | Admitting: Orthopaedic Surgery

## 2020-02-26 ENCOUNTER — Ambulatory Visit (HOSPITAL_COMMUNITY)
Admission: RE | Admit: 2020-02-26 | Discharge: 2020-02-26 | Disposition: A | Discharge status: Home / Self Care | Payer: 59 | Attending: Orthopaedic Surgery | Admitting: Orthopaedic Surgery

## 2020-02-26 ENCOUNTER — Other Ambulatory Visit: Payer: Self-pay

## 2020-02-26 DIAGNOSIS — M21069 Valgus deformity, not elsewhere classified, unspecified knee: Secondary | ICD-10-CM | POA: Diagnosis not present

## 2020-02-26 DIAGNOSIS — M84369A Stress fracture, unspecified tibia and fibula, initial encounter for fracture: Secondary | ICD-10-CM | POA: Diagnosis not present

## 2020-02-26 DIAGNOSIS — Z5329 Procedure and treatment not carried out because of patient's decision for other reasons: Secondary | ICD-10-CM | POA: Diagnosis not present

## 2020-02-26 HISTORY — DX: Unspecified asthma, uncomplicated: J45.909

## 2020-02-26 LAB — BASIC METABOLIC PANEL
Anion gap: 10 (ref 5–15)
BUN: 11 mg/dL (ref 8–23)
CO2: 26 mmol/L (ref 22–32)
Calcium: 9 mg/dL (ref 8.9–10.3)
Chloride: 103 mmol/L (ref 98–111)
Creatinine, Ser: 0.68 mg/dL (ref 0.44–1.00)
GFR, Estimated: >60 mL/min (ref >60)
Glucose, Bld: 125 mg/dL — ABNORMAL HIGH (ref 70–99)
Potassium: 3.5 mmol/L (ref 3.5–5.1)
Sodium: 139 mmol/L (ref 135–145)

## 2020-02-26 LAB — CBC
HCT: 33.8 % — ABNORMAL LOW (ref 36.0–46.0)
Hemoglobin: 11 g/dL — ABNORMAL LOW (ref 12.0–15.0)
MCH: 22.8 pg — ABNORMAL LOW (ref 26.0–34.0)
MCHC: 32.5 g/dL (ref 30.0–36.0)
MCV: 70 fL — ABNORMAL LOW (ref 80.0–100.0)
Platelets: 271 10*3/uL (ref 150–400)
RBC: 4.83 MIL/uL (ref 3.87–5.11)
RDW: 15.8 % — ABNORMAL HIGH (ref 11.5–15.5)
WBC: 9.2 10*3/uL (ref 4.0–10.5)
nRBC: 0 % (ref 0.0–0.2)

## 2020-02-26 SURGERY — OPEN REDUCTION INTERNAL FIXATION (ORIF) TIBIA FRACTURE
Anesthesia: General | Laterality: Right

## 2020-02-26 MED ORDER — ONDANSETRON HCL 4 MG/2ML IJ SOLN
INTRAMUSCULAR | Status: AC
  Filled 2020-02-26: qty 2

## 2020-02-26 MED ORDER — OXYCODONE HCL 5 MG PO TABS
5.0000 mg | ORAL_TABLET | ORAL | 0 refills | Status: AC | PRN
Start: 2020-02-26 — End: 2020-03-04

## 2020-02-26 MED ORDER — MIDAZOLAM HCL 2 MG/2ML IJ SOLN
INTRAMUSCULAR | Status: AC
  Filled 2020-02-26: qty 2

## 2020-02-26 MED ORDER — PROPOFOL 10 MG/ML IV BOLUS
INTRAVENOUS | Status: AC
  Filled 2020-02-26: qty 40

## 2020-02-26 MED ORDER — FENTANYL CITRATE (PF) 250 MCG/5ML IJ SOLN
INTRAMUSCULAR | Status: AC
  Filled 2020-02-26: qty 5

## 2020-02-26 MED ORDER — DEXAMETHASONE SODIUM PHOSPHATE 10 MG/ML IJ SOLN
INTRAMUSCULAR | Status: AC
  Filled 2020-02-26: qty 1

## 2020-02-26 MED ORDER — CHLORHEXIDINE GLUCONATE 0.12 % MT SOLN
15.0000 mL | Freq: Once | OROMUCOSAL | Status: AC
  Administered 2020-02-26: 15 mL via OROMUCOSAL
  Filled 2020-02-26: qty 15

## 2020-02-26 MED ORDER — LACTATED RINGERS IV SOLN: INTRAVENOUS | Status: DC

## 2020-02-26 MED ORDER — ORAL CARE MOUTH RINSE: 15.0000 mL | Freq: Once | OROMUCOSAL | Status: AC

## 2020-02-26 MED ORDER — CEFAZOLIN SODIUM-DEXTROSE 2-4 GM/100ML-% IV SOLN
2.0000 g | INTRAVENOUS | Status: DC
  Filled 2020-02-26: qty 100

## 2020-02-26 NOTE — Progress Notes (Signed)
Patient discharged from department via wheelchair with NT.

## 2020-02-26 NOTE — Progress Notes (Signed)
Patient presented for surgery this morning for intramedullary nailing of her tibial stress fracture with valgus deformity and cortical disruption in the setting of severe knee and hindfoot valgus with history of fibular stress fractures and deformity. Patient wished to cancel the surgery and proceed with nonoperative care after lengthy discussion with her and her family. Ultimately my recommendation was for intramedullary nailing of her tibia to allow for early weightbearing and to ensure no progressive deformity of her tibia. She opted to proceed with cast immobilization and nonweightbearing as her main concern was her pain level.  Short leg nonweightbearing cast was ordered and placed. She was discharged from the preoperative area. She will follow-up with me in 2 weeks for x-rays in the cast. She was instructed to maintain nonweightbearing.

## 2020-02-26 NOTE — Progress Notes (Signed)
Orthopedic Tech Progress Note Patient Details:  Jessica Brown 02-26-1945 [id number] Added extra padding to foot and ANKLE  Casting Type of Cast: Short leg cast Cast Location: RLE Cast Material: Fiberglass Cast Intervention: Application  Post Interventions Patient Tolerated: Well Instructions Provided: Care of device     Donald Pore 02/26/2020, 8:22 AM

## 2020-03-12 DIAGNOSIS — M79661 Pain in right lower leg: Secondary | ICD-10-CM | POA: Diagnosis not present

## 2020-03-12 DIAGNOSIS — M84361A Stress fracture, right tibia, initial encounter for fracture: Secondary | ICD-10-CM | POA: Diagnosis not present

## 2020-04-09 DIAGNOSIS — M79661 Pain in right lower leg: Secondary | ICD-10-CM | POA: Diagnosis not present

## 2020-04-09 DIAGNOSIS — M84361A Stress fracture, right tibia, initial encounter for fracture: Secondary | ICD-10-CM | POA: Diagnosis not present

## 2022-03-06 IMAGING — CR DG ANKLE COMPLETE 3+V*R*
3 series · 3 of 3 positions shown · non-contrast
Comparison: None.

CLINICAL DATA: Right lateral ankle pain and swelling for 3 days.

EXAM:
RIGHT ANKLE - COMPLETE 3+ VIEW

[t ankle joint ap right]
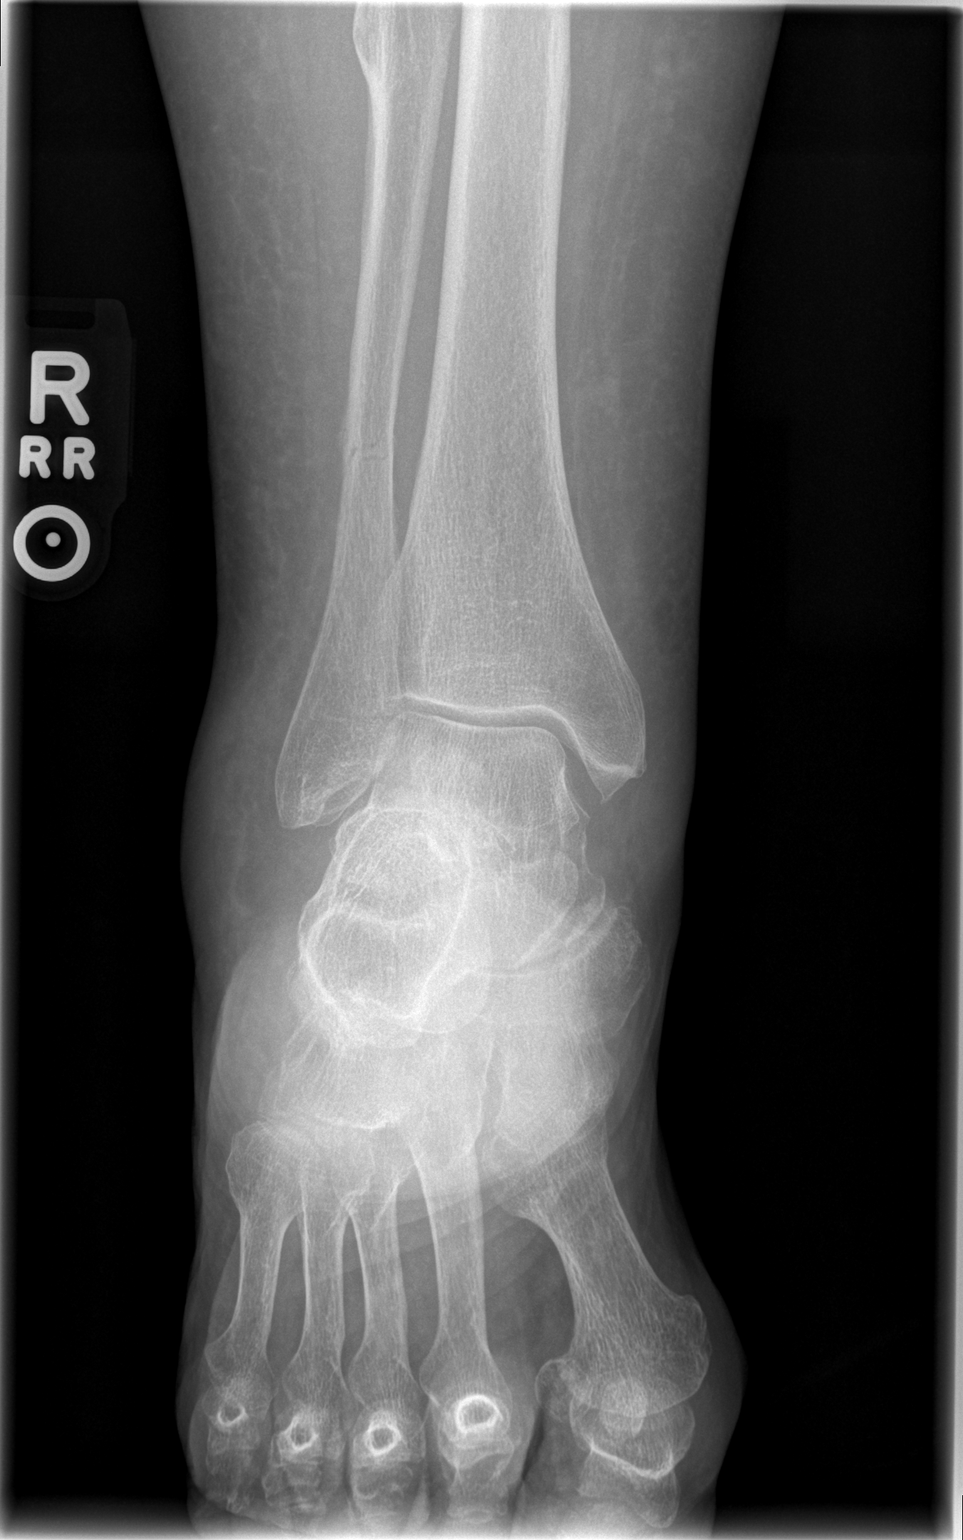

[t ankle joint oblique right]
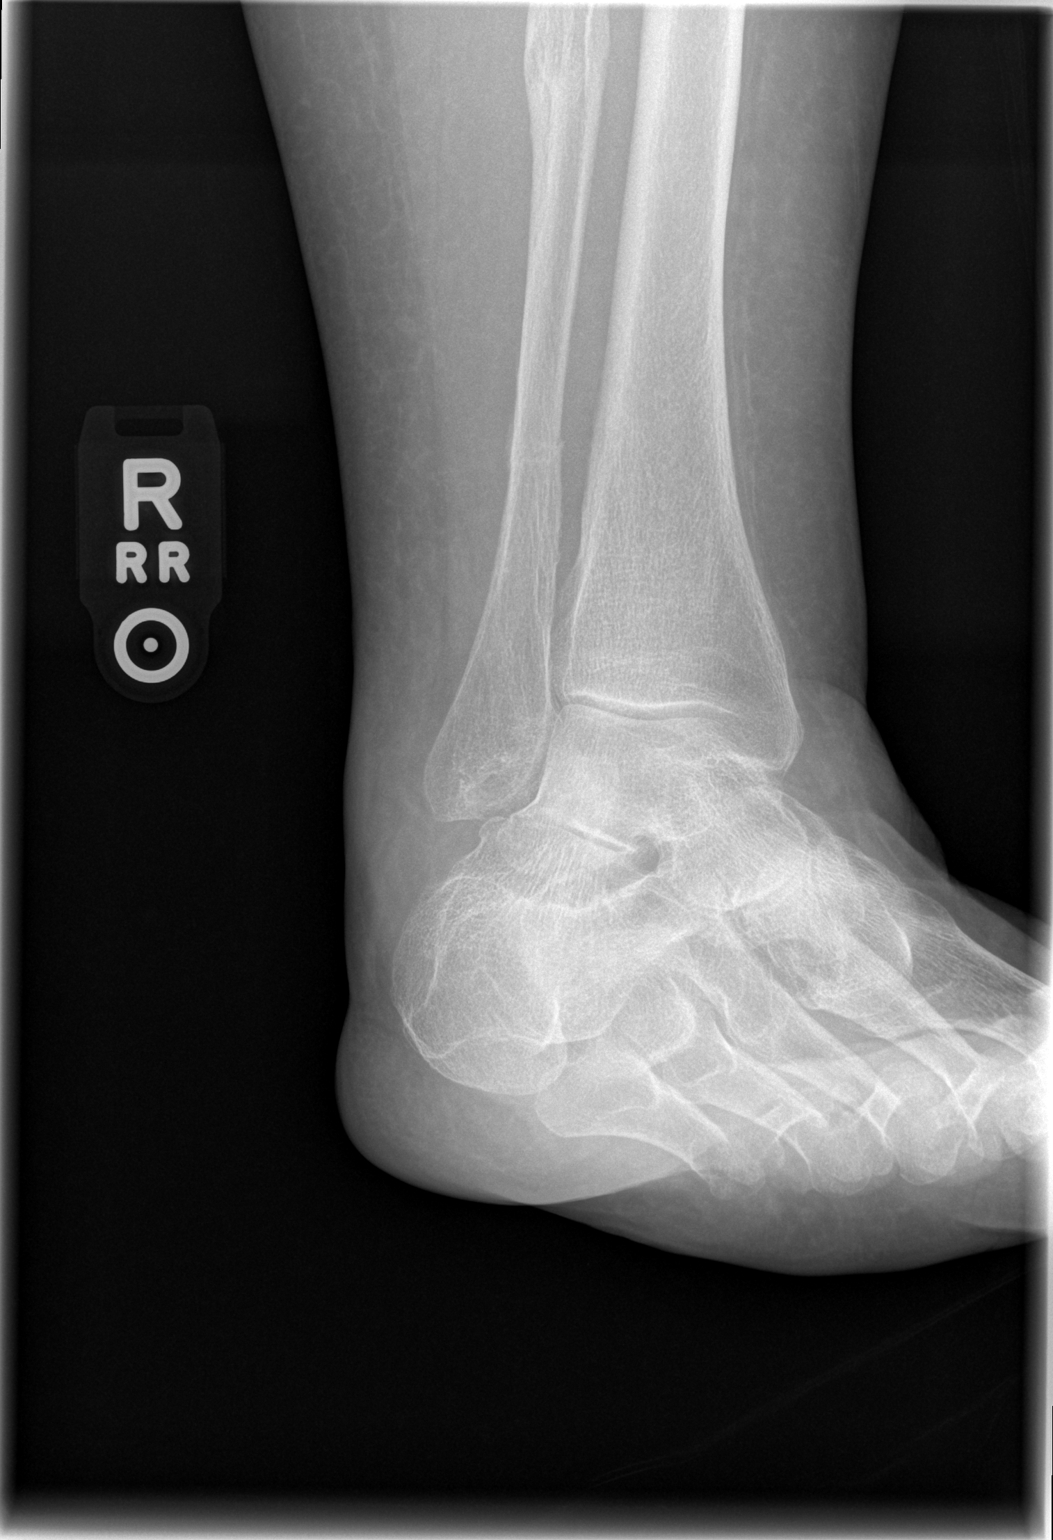

[t ankle joint lat right]
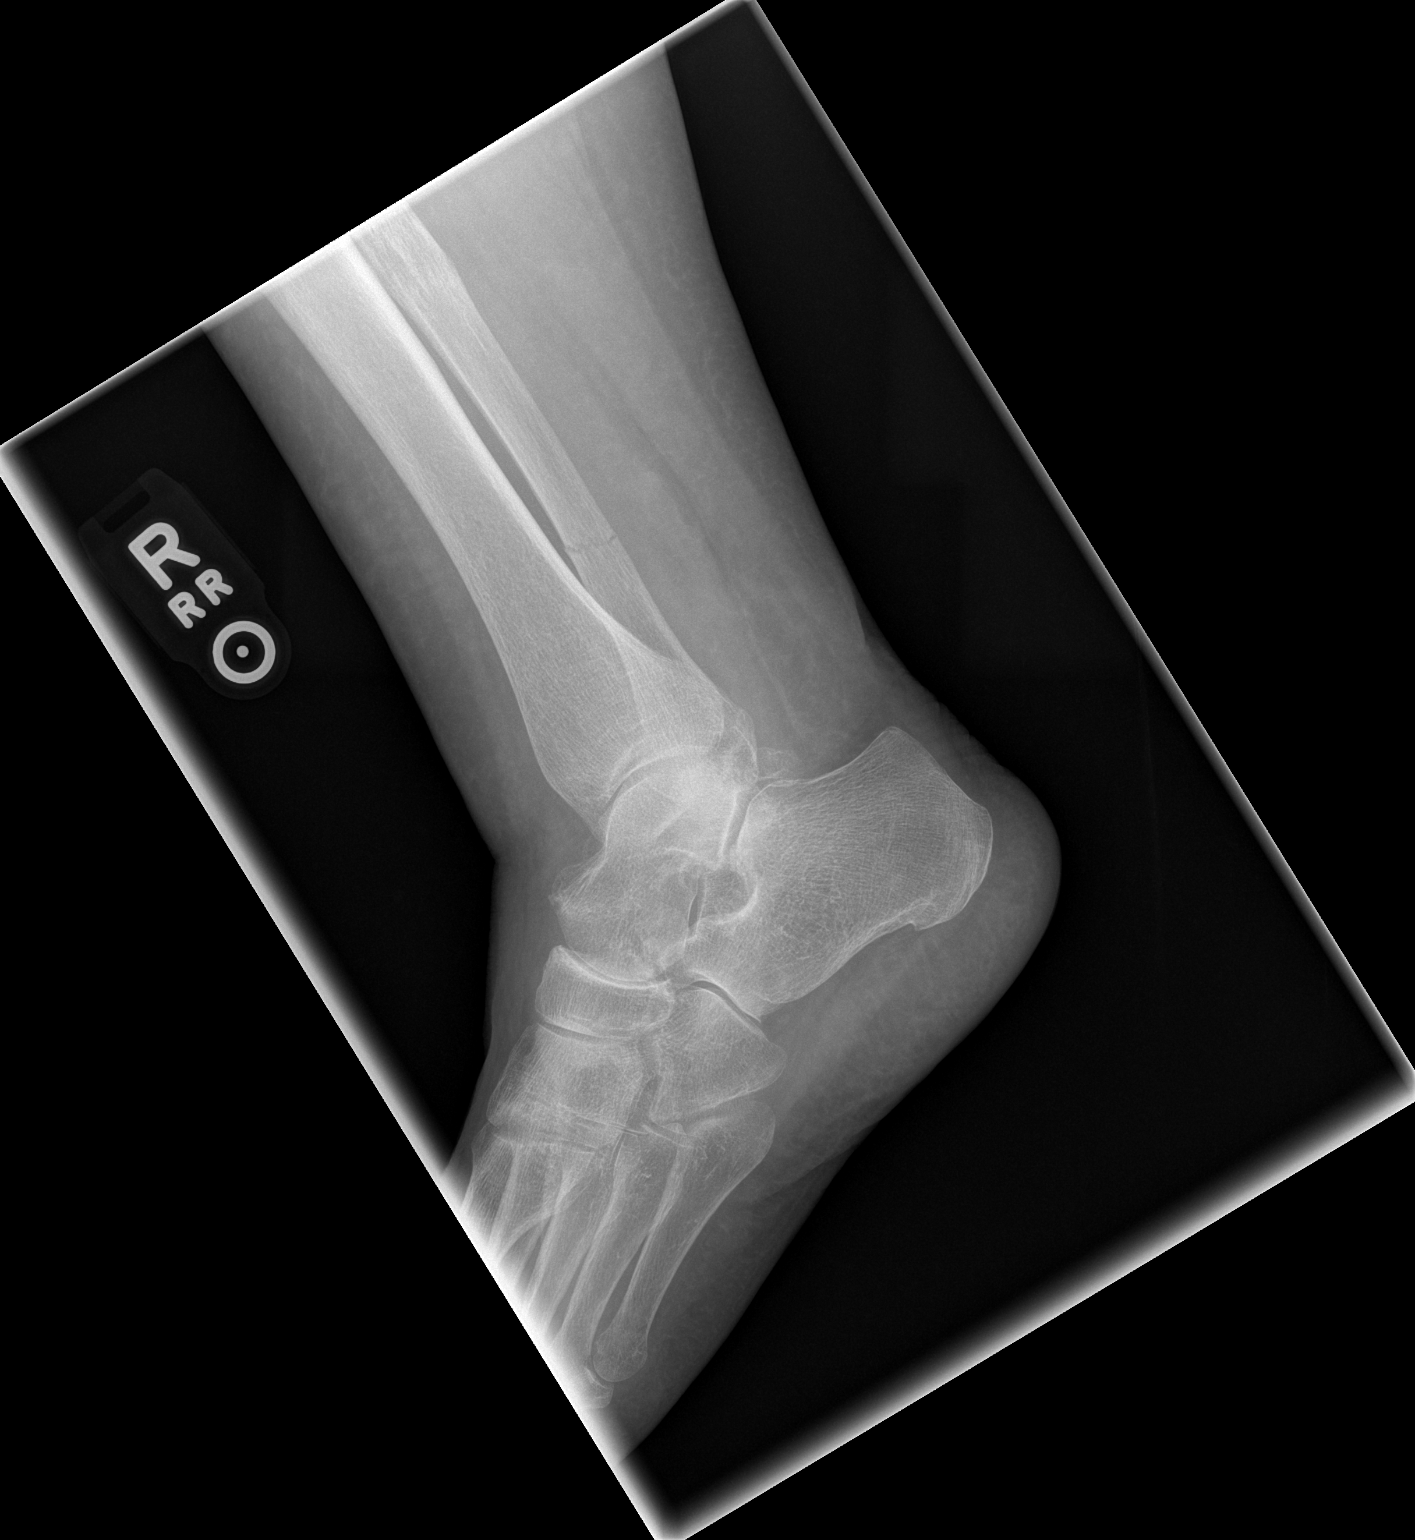

[3 of 3 positions shown; findings below may reference images not displayed]

FINDINGS: Evidence of remote healed distal fibular fracture at the middle
third distal third junction region. There is also a stress fracture
involving the distal fibular shaft. No displacement. Some early
cortical reaction. The tibia is intact. The ankle joint is
maintained. No ankle fracture or osteochondral lesion. No definite
joint effusion.

The mid and hindfoot bony structures are intact.

Moderate diffuse soft tissue swelling.
IMPRESSION: 1. Nondisplaced stress fracture involving the distal fibular shaft.
2. Remote healed mid distal fibular shaft fracture.
3. No acute ankle fracture.

## 2023-02-20 ENCOUNTER — Emergency Department (HOSPITAL_BASED_OUTPATIENT_CLINIC_OR_DEPARTMENT_OTHER): Payer: Commercial Managed Care - PPO | Admitting: Radiology

## 2023-02-20 ENCOUNTER — Emergency Department (HOSPITAL_BASED_OUTPATIENT_CLINIC_OR_DEPARTMENT_OTHER)
Admission: EM | Admit: 2023-02-20 | Discharge: 2023-02-20 | Disposition: A | Discharge status: Home / Self Care | Payer: Commercial Managed Care - PPO | Attending: Emergency Medicine | Admitting: Emergency Medicine

## 2023-02-20 ENCOUNTER — Encounter (HOSPITAL_BASED_OUTPATIENT_CLINIC_OR_DEPARTMENT_OTHER): Payer: Self-pay | Admitting: Emergency Medicine

## 2023-02-20 ENCOUNTER — Other Ambulatory Visit: Payer: Self-pay

## 2023-02-20 DIAGNOSIS — R079 Chest pain, unspecified: Secondary | ICD-10-CM | POA: Insufficient documentation

## 2023-02-20 DIAGNOSIS — R0789 Other chest pain: Secondary | ICD-10-CM | POA: Diagnosis not present

## 2023-02-20 DIAGNOSIS — D72829 Elevated white blood cell count, unspecified: Secondary | ICD-10-CM | POA: Insufficient documentation

## 2023-02-20 DIAGNOSIS — Z1152 Encounter for screening for COVID-19: Secondary | ICD-10-CM | POA: Diagnosis not present

## 2023-02-20 DIAGNOSIS — R1013 Epigastric pain: Secondary | ICD-10-CM | POA: Diagnosis not present

## 2023-02-20 DIAGNOSIS — R011 Cardiac murmur, unspecified: Secondary | ICD-10-CM | POA: Diagnosis not present

## 2023-02-20 DIAGNOSIS — I700 Atherosclerosis of aorta: Secondary | ICD-10-CM | POA: Diagnosis not present

## 2023-02-20 LAB — RESP PANEL BY RT-PCR (RSV, FLU A&B, COVID)  RVPGX2
Influenza A by PCR: NEGATIVE
Influenza B by PCR: NEGATIVE
Resp Syncytial Virus by PCR: NEGATIVE
SARS Coronavirus 2 by RT PCR: NEGATIVE

## 2023-02-20 LAB — BASIC METABOLIC PANEL
Anion gap: 11 (ref 5–15)
BUN: 12 mg/dL (ref 8–23)
CO2: 29 mmol/L (ref 22–32)
Calcium: 10.2 mg/dL (ref 8.9–10.3)
Chloride: 95 mmol/L — ABNORMAL LOW (ref 98–111)
Creatinine, Ser: 0.65 mg/dL (ref 0.44–1.00)
GFR, Estimated: >60 mL/min (ref >60)
Glucose, Bld: 131 mg/dL — ABNORMAL HIGH (ref 70–99)
Potassium: 3.5 mmol/L (ref 3.5–5.1)
Sodium: 135 mmol/L (ref 135–145)

## 2023-02-20 LAB — CBC
HCT: 40.7 % (ref 36.0–46.0)
Hemoglobin: 13.2 g/dL (ref 12.0–15.0)
MCH: 22 pg — ABNORMAL LOW (ref 26.0–34.0)
MCHC: 32.4 g/dL (ref 30.0–36.0)
MCV: 67.7 fL — ABNORMAL LOW (ref 80.0–100.0)
Platelets: 437 10*3/uL — ABNORMAL HIGH (ref 150–400)
RBC: 6.01 MIL/uL — ABNORMAL HIGH (ref 3.87–5.11)
RDW: 14.9 % (ref 11.5–15.5)
WBC: 14 10*3/uL — ABNORMAL HIGH (ref 4.0–10.5)
nRBC: 0 % (ref 0.0–0.2)

## 2023-02-20 LAB — TROPONIN I (HIGH SENSITIVITY)
Troponin I (High Sensitivity): 5 ng/L (ref <18)
Troponin I (High Sensitivity): 6 ng/L (ref <18)

## 2023-02-20 MED ORDER — FAMOTIDINE 20 MG PO TABS
20.0000 mg | ORAL_TABLET | Freq: Two times a day (BID) | ORAL | 0 refills | Status: AC
Start: 2023-02-20 — End: ?

## 2023-02-20 MED ORDER — FAMOTIDINE 20 MG PO TABS
20.0000 mg | ORAL_TABLET | Freq: Once | ORAL | Status: AC
  Administered 2023-02-20: 20 mg via ORAL
  Filled 2023-02-20: qty 1

## 2023-02-20 NOTE — ED Provider Triage Note (Signed)
Emergency Medicine Provider Triage Evaluation Note  Jessica Brown , a 78 y.o. female  was evaluated in triage.  Pt complains of lower chest and epigastric burning pain starting this morning, unrelieved with Tums.  She is worried about having COVID or the flu.  She denies fever, runny nose, sore throat or cough.   No cardiac history.  She does have a murmur.  Review of Systems  Positive: Chest burning Negative: Vomiting, URI symptoms  Physical Exam  BP (!) 147/89 (BP Location: Right Arm)   Pulse 84   Temp (!) 97 F (36.1 C)   Resp 16   SpO2 97%  Gen:   Awake, no distress   Resp:  Normal effort  MSK:   Moves extremities without difficulty  Other:  Systolic murmur heard right upper and left upper sternal border  Medical Decision Making  Medically screening exam initiated at 3:15 PM.  Appropriate orders placed.  Jessica Brown was informed that the remainder of the evaluation will be completed by another provider, this initial triage assessment does not replace that evaluation, and the importance of remaining in the ED until their evaluation is complete.  Will check flu and COVID, however discussed with patient that her symptoms are not typical for flu or COVID.  Would advise cardiac workup.  Ordered.    Renne Crigler, PA-C 02/20/23 1517

## 2023-02-20 NOTE — ED Triage Notes (Signed)
Burning sensation to mid chest for 2 days and chills.

## 2023-02-20 NOTE — Discharge Instructions (Signed)
Please read and follow all provided instructions.  Your diagnoses today include:  1. Epigastric pain     Tests performed today include: An EKG of your heart A chest x-ray Cardiac enzymes - a blood test for heart muscle damage Blood counts and electrolytes: white blood cell count was high Vital signs. See below for your results today.   Medications prescribed:  Pepcid (famotidine) - antihistamine  You can find this medication over-the-counter.   DO NOT exceed:  20mg  Pepcid every 12 hours  Take any prescribed medications only as directed.  Follow-up instructions: Please follow-up with your primary care provider as soon as you can for further evaluation of your symptoms.   Return instructions:  SEEK IMMEDIATE MEDICAL ATTENTION IF: You have severe chest pain, especially if the pain is crushing or pressure-like and spreads to the arms, back, neck, or jaw, or if you have sweating, nausea or vomiting, or trouble with breathing. THIS IS AN EMERGENCY. Do not wait to see if the pain will go away. Get medical help at once. Call 911. DO NOT drive yourself to the hospital.  Your chest pain gets worse and does not go away after a few minutes of rest.  You have an attack of chest pain lasting longer than what you usually experience.  You have significant dizziness, if you pass out, or have trouble walking.  You have chest pain not typical of your usual pain for which you originally saw your caregiver.  You have any other emergent concerns regarding your health.  Additional Information: Chest pain comes from many different causes. Your caregiver has diagnosed you as having chest pain that is not specific for one problem, but does not require admission.  You are at low risk for an acute heart condition or other serious illness.   Your vital signs today were: BP 137/61 (BP Location: Right Arm)   Pulse 87   Temp (!) 97 F (36.1 C)   Resp 18   SpO2 99%  If your blood pressure (BP) was  elevated above 135/85 this visit, please have this repeated by your doctor within one month. --------------

## 2023-02-20 NOTE — ED Provider Notes (Signed)
[facility] EMER[facility]ENC[facility] DEPARTMENT AT [facility] Provider Note   CSN: [id number] Arrival date & time: 02/20/23  1408     History  No chief complaint on file.   Jessica Brown is a 78 y.o. female.  Patient seen by myself on arrival in triage.  Pt complains of lower chest and epigastric burning pain starting this morning, unrelieved with Tums.  It has been intermittent.  No associated nausea, vomiting, diaphoresis.  No shortness of breath.  Not exertional.  Symptoms do not radiate.  She is worried about having COVID or the flu.  She denies fever, runny nose, sore throat or cough.   No cardiac history.  She does have a murmur.       Home Medications Prior to Admission medications   Medication Sig Start Date End Date Taking? Authorizing Provider  acetaminophen (TYLENOL) 500 MG tablet Take 1,000 mg by mouth every 4 (four) hours as needed (pain).    [provider]  Naphazoline-Glycerin (CLEAR EYES COOLING COMFORT) 0.03-0.5 % SOLN Place 1 drop into both eyes daily.    [provider]      Allergies    Patient has no known allergies.    Review of Systems   Review of Systems  Physical Exam Updated Vital Signs BP 137/61   Pulse 92   Temp (!) 97 F (36.1 C)   Resp 18   SpO2 100%   Physical Exam Vitals and nursing note reviewed.  Constitutional:      Appearance: She is well-developed. She is not diaphoretic.  HENT:     Head: Normocephalic and atraumatic.     Mouth/Throat:     Mouth: Mucous membranes are not dry.  Eyes:     Conjunctiva/sclera: Conjunctivae normal.  Neck:     Vascular: Normal carotid pulses. No JVD.     Trachea: Trachea normal. No tracheal deviation.  Cardiovascular:     Rate and Rhythm: Normal rate and regular rhythm.     Pulses: No decreased pulses.          Radial pulses are 2+ on the right side and 2+ on the left side.     Heart sounds: S1 normal and S2 normal. Murmur heard.  Pulmonary:     Effort: Pulmonary effort is  normal. No respiratory distress.     Breath sounds: No wheezing.  Chest:     Chest wall: No tenderness.  Abdominal:     General: Bowel sounds are normal.     Palpations: Abdomen is soft.     Tenderness: There is no abdominal tenderness. There is no guarding or rebound.  Musculoskeletal:        General: Normal range of motion.     Cervical back: Normal range of motion and neck supple. No muscular tenderness.  Skin:    General: Skin is warm and dry.     Coloration: Skin is not pale.  Neurological:     Mental Status: She is alert.     ED Results / Procedures / Treatments   Labs (all labs ordered are listed, but only abnormal results are displayed) Labs Reviewed  BASIC METABOLIC PANEL - Abnormal; Notable for the following components:      Result Value   Chloride 95 (*)    Glucose, Bld 131 (*)    All other components within normal limits  CBC - Abnormal; Notable for the following components:   WBC 14.0 (*)    RBC 6.01 (*)    MCV 67.7 (*)  MCH 22.0 (*)    Platelets 437 (*)    All other components within normal limits  RESP PANEL BY RT-PCR (RSV, FLU A&B, COVID)  [id number]  TROPONIN I (HIGH SENSITIVITY)  TROPONIN I (HIGH SENSITIVITY)    ED ECG REPORT   Date: 02/20/2023  Rate: 82  Rhythm: normal sinus rhythm ,PAC  QRS Axis: left  Intervals: normal  ST/T Wave abnormalities: normal  Conduction Disutrbances:none  Narrative Interpretation:   Old EKG Reviewed: unchanged from 10/2015  I have personally reviewed the EKG tracing and agree with the computerized printout as noted.   Radiology DG Chest 2 View Result Date: 02/20/2023 CLINICAL DATA:  Chest pain. EXAM: CHEST - 2 VIEW COMPARISON:  None Available. FINDINGS: The heart size and mediastinal contours are within normal limits. Aortic atherosclerosis. Mild bibasilar atelectasis/scarring. No focal consolidation, pleural effusion, or pneumothorax. 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 Multilevel degenerative  changes of the spine. No acute osseous abnormality identified. IMPRESSION: Mild bibasilar atelectasis/scarring. Otherwise, no acute cardiopulmonary findings. Electronically Signed   By: Hart Robinsons M.D.   On: 02/20/2023 15:58    Procedures Procedures    Medications Ordered in ED Medications - No data to display  ED Course/ Medical Decision Making/ A&P  Patient seen and examined. History obtained directly from patient. Work-up including labs, imaging, EKG ordered in triage, if performed, were reviewed.    Labs/EKG: Independently reviewed and interpreted.  This included: CBC with elevated white blood cell count of 14,000, normal hemoglobin: BMP unremarkable; troponin normal at 5.  Second pending.  EKG reviewed as above.  Imaging: Independently visualized and interpreted.  This included: Chest x-ray, agree negative.  Medications/Fluids: None ordered  Most recent vital signs reviewed and are as follows: BP 137/61   Pulse 92   Temp (!) 97 F (36.1 C)   Resp 18   SpO2 100%   Initial impression: Epigastric pain, atypical.  Currently resolved at reexam.  Awaiting second troponin.  7:24 PM Reassessment performed. Patient appears comfortable, no recurrent pain.  Labs personally reviewed and interpreted including: Troponin 5 >> 6.   Reviewed pertinent lab work and imaging with patient at bedside. Questions answered.   Most current vital signs reviewed and are as follows: BP 137/61 (BP Location: Right Arm)   Pulse 87   Temp (!) 97 F (36.1 C)   Resp 18   SpO2 99%   Plan: Discharge to home.   Prescriptions written for: Pepcid  Other home care instructions discussed: Parke Simmers diet  Return and follow-up instructions: I encouraged patient to return to ED with severe chest pain, especially if the pain is crushing or pressure-like and spreads to the arms, back, neck, or jaw, or if they have associated sweating, vomiting, or shortness of breath with the pain, or significant pain with  activity. We discussed that the evaluation here today indicates a low-risk of serious cause of chest pain, including heart trouble or a blood clot, but no evaluation is perfect and chest pain can evolve with time. The patient verbalized understanding and agreed.  I encouraged patient to follow-up with their provider in the next 48 hours for recheck.                                  Medical Decision Making Amount and/or Complexity of Data Reviewed Labs: ordered. Radiology: ordered.   For this patient's complaint of chest pain, the following emergent conditions were considered  on the differential diagnosis: acute coronary syndrome, pulmonary embolism, pneumothorax, myocarditis, pericardial tamponade, aortic dissection, thoracic aortic aneurysm complication, esophageal perforation.   Other causes were also considered including: gastroesophageal reflux disease, musculoskeletal pain including costochondritis, pneumonia/pleurisy, herpes zoster, pericarditis.  In regards to possibility of ACS, patient has atypical features of pain, non-ischemic and unchanged EKG and negative troponin(s).   In regards to possibility of PE, symptoms are atypical for PE and risk profile is low, making PE low likelihood.   For this patient's complaint of abdominal pain, the following conditions were considered on the differential diagnosis: gastritis/PUD, enteritis/duodenitis, appendicitis, cholelithiasis/cholecystitis, cholangitis, pancreatitis, ruptured viscus, colitis, diverticulitis, small/large bowel obstruction, proctitis, cystitis, pyelonephritis, ureteral colic, aortic dissection, aortic aneurysm. Atypical chest etiologies were also considered including ACS, PE, and pneumonia.  The patient's vital signs, pertinent lab work and imaging were reviewed and interpreted as discussed in the ED course. Hospitalization was considered for further testing, treatments, or serial exams/observation. However as patient is  well-appearing, has a stable exam, and reassuring studies today, I do not feel that they warrant admission at this time. This plan was discussed with the patient who verbalizes agreement and comfort with this plan and seems reliable and able to return to the Emergency Department with worsening or changing symptoms.          Final Clinical Impression(s) / ED Diagnoses Final diagnoses:  Epigastric pain    Rx / DC Orders ED Discharge Orders          Ordered    famotidine (PEPCID) 20 MG tablet  2 times daily        02/20/23 1923              Renne Crigler, Cordelia Poche 02/20/23 1926    Charlynne Pander, MD 02/20/23 [phone]

## 2023-04-27 DIAGNOSIS — R404 Transient alteration of awareness: Secondary | ICD-10-CM | POA: Diagnosis not present
